# Patient Record
Sex: Male | Born: 1950 | Race: White | Hispanic: No | Marital: Single | Smoking: Never smoker
Health system: Southern US, Community
[De-identification: ages and names within clinical notes are randomized; demographics above are authoritative.]

## PROBLEM LIST (undated history)

## (undated) DIAGNOSIS — I10 Essential (primary) hypertension: Secondary | ICD-10-CM

## (undated) DIAGNOSIS — I639 Cerebral infarction, unspecified: Secondary | ICD-10-CM

---

## 2016-05-06 ENCOUNTER — Emergency Department (HOSPITAL_COMMUNITY): Payer: Medicare Other

## 2016-05-06 ENCOUNTER — Encounter (HOSPITAL_COMMUNITY): Payer: Self-pay | Admitting: Emergency Medicine

## 2016-05-06 ENCOUNTER — Emergency Department (HOSPITAL_COMMUNITY)
Admission: EM | Admit: 2016-05-06 | Discharge: 2016-05-06 | Disposition: A | Discharge status: Home / Self Care | Payer: Medicare Other | Attending: Emergency Medicine | Admitting: Emergency Medicine

## 2016-05-06 DIAGNOSIS — Y9339 Activity, other involving climbing, rappelling and jumping off: Secondary | ICD-10-CM | POA: Insufficient documentation

## 2016-05-06 DIAGNOSIS — W1789XA Other fall from one level to another, initial encounter: Secondary | ICD-10-CM | POA: Insufficient documentation

## 2016-05-06 DIAGNOSIS — Z8673 Personal history of transient ischemic attack (TIA), and cerebral infarction without residual deficits: Secondary | ICD-10-CM | POA: Insufficient documentation

## 2016-05-06 DIAGNOSIS — Y929 Unspecified place or not applicable: Secondary | ICD-10-CM | POA: Insufficient documentation

## 2016-05-06 DIAGNOSIS — I10 Essential (primary) hypertension: Secondary | ICD-10-CM | POA: Diagnosis not present

## 2016-05-06 DIAGNOSIS — Y999 Unspecified external cause status: Secondary | ICD-10-CM | POA: Insufficient documentation

## 2016-05-06 DIAGNOSIS — S2020XA Contusion of thorax, unspecified, initial encounter: Secondary | ICD-10-CM | POA: Insufficient documentation

## 2016-05-06 DIAGNOSIS — S299XXA Unspecified injury of thorax, initial encounter: Secondary | ICD-10-CM | POA: Diagnosis present

## 2016-05-06 DIAGNOSIS — S20211A Contusion of right front wall of thorax, initial encounter: Secondary | ICD-10-CM

## 2016-05-06 HISTORY — DX: Cerebral infarction, unspecified: I63.9

## 2016-05-06 HISTORY — DX: Essential (primary) hypertension: I10

## 2016-05-06 MED ORDER — ALBUTEROL SULFATE (2.5 MG/3ML) 0.083% IN NEBU: 5.0000 mg | INHALATION_SOLUTION | Freq: Once | RESPIRATORY_TRACT | Status: DC

## 2016-05-06 MED ORDER — METHOCARBAMOL 500 MG PO TABS
750.0000 mg | ORAL_TABLET | Freq: Once | ORAL | Status: AC
  Administered 2016-05-06: 750 mg via ORAL
  Filled 2016-05-06: qty 2

## 2016-05-06 MED ORDER — IBUPROFEN 200 MG PO TABS
400.0000 mg | ORAL_TABLET | Freq: Once | ORAL | Status: AC
  Administered 2016-05-06: 400 mg via ORAL
  Filled 2016-05-06: qty 2

## 2016-05-06 MED ORDER — METHOCARBAMOL 500 MG PO TABS: 1000.0000 mg | ORAL_TABLET | Freq: Three times a day (TID) | ORAL | 0 refills | Status: AC | PRN

## 2016-05-06 MED ORDER — HYDROCODONE-ACETAMINOPHEN 5-325 MG PO TABS
2.0000 | ORAL_TABLET | Freq: Once | ORAL | Status: AC
  Administered 2016-05-06: 2 via ORAL
  Filled 2016-05-06: qty 2

## 2016-05-06 NOTE — ED Notes (Signed)
ED Provider at bedside. 

## 2016-05-06 NOTE — ED Triage Notes (Signed)
Pt reports having pain right ribs after a fall on 05/01/16. Pt states since then he has been having increasing pain and difficulty with taking a deep breath. Pt was seen at hospital in [place] when injury occurred and no fractures were seen then.

## 2016-05-06 NOTE — ED Provider Notes (Signed)
[facility]-EMERGENCY DEPT Provider Note   CSN: [id number] Arrival date & time: 05/06/16  0518     History   Chief Complaint Chief Complaint  Patient presents with  . Rib Injury    HPI Nicholas Clarke is a 66 y.o. male.  Patient c/o fall with chest wall injury 5 days ago.  States was climbing over fence, and fell onto right side. c/o right mid to lower rib pain. Pain is constant, dull, moderate-sev, worse w certain movements, position changes, sneezing, palpation to area. No sob. No cough or uri c/o. No fever or chills. No abd pain. Denies head injury or loc. No headache. No neck or back pain. Denies other pain or injury. Was seen at outside facility, taking 1 hydrocodone prn.    The history is provided by the patient.    Past Medical History:  Diagnosis Date  . Hypertension   . Stroke [facility])    right side weakness post CVA    There are no active problems to display for this patient.   History reviewed. No pertinent surgical history.     Home Medications    Prior to Admission medications   Not on File    Family History History reviewed. No pertinent family history.  Social History Social History  Substance Use Topics  . Smoking status: Never Smoker  . Smokeless tobacco: Never Used  . Alcohol use Yes     Allergies   Patient has no known allergies.   Review of Systems Review of Systems  Constitutional: Negative for fever.  Respiratory: Negative for cough and shortness of breath.   Cardiovascular:       Right lateral chest wall pain  Gastrointestinal: Negative for abdominal pain and vomiting.  Genitourinary: Negative for flank pain and hematuria.  Musculoskeletal: Negative for back pain and neck pain.  Skin: Negative for wound.  Neurological: Negative for weakness, numbness and headaches.     Physical Exam Updated Vital Signs BP 122/77 (BP Location: Left Arm)   Pulse 81   Temp 97.7 F (36.5 C) (Oral)   Resp 20   Ht  (1.702 m)   Wt 74.8 kg    SpO2 97%   BMI 25.84 kg/m   Physical Exam  Constitutional: He appears well-developed and well-nourished. No distress.  HENT:  Head: Atraumatic.  Eyes: Conjunctivae are normal.  Neck: Neck supple. No tracheal deviation present.  Cardiovascular: Normal rate, regular rhythm, normal heart sounds and intact distal pulses.  Exam reveals no gallop and no friction rub.   No murmur heard. Pulmonary/Chest: Effort normal and breath sounds normal. No accessory muscle usage. No respiratory distress. He exhibits tenderness.  Right lateral chest wall tenderness. Normal chest wall movement.   Abdominal: Soft. Bowel sounds are normal. He exhibits no distension. There is no tenderness.  No abd bruising or contusion.   Genitourinary:  Genitourinary Comments: No cva tenderness  Musculoskeletal: He exhibits no edema.  CTLS spine, non tender, aligned, no step off.   Neurological: He is alert.  Steady gait.   Skin: Skin is warm and dry. No rash noted. He is not diaphoretic.  Psychiatric: He has a normal mood and affect.  Nursing note and vitals reviewed.    ED Treatments / Results  Labs (all labs ordered are listed, but only abnormal results are displayed) Labs Reviewed - No data to display  EKG  EKG Interpretation None       Radiology Dg Chest 2 View  Result Date: 05/06/2016 CLINICAL DATA:  Rib pain after fall May 01, 2016. EXAM: CHEST  2 VIEW COMPARISON:  None. FINDINGS: Cardiomediastinal silhouette is unremarkable for this low inspiratory examination with crowded vasculature markings. Calcified aortic arch. The lungs are clear without pleural effusions or focal consolidations. Trachea projects midline and there is no pneumothorax. Included soft tissue planes and osseous structures are non-suspicious. IMPRESSION: No acute cardiopulmonary process. Electronically Signed   By: Awilda Metro M.D.   On: 05/06/2016 06:07    Procedures Procedures (including critical care  time)  Medications Ordered in ED Medications  ibuprofen (ADVIL,MOTRIN) tablet 400 mg (not administered)  HYDROcodone-acetaminophen (NORCO/VICODIN) 5-325 MG per tablet 2 tablet (not administered)  methocarbamol (ROBAXIN) tablet 750 mg (not administered)     Initial Impression / Assessment and Plan / ED Course  I have reviewed the triage vital signs and the nursing notes.  Pertinent labs & imaging results that were available during my care of the patient were reviewed by me and considered in my medical decision making (see chart for details).  Pt last took a single hydrocodone approx 5 hrs ago.  Has a ride, does not have to drive.  Hydrocodone po. Motrin po.  Incentive spirometer - discussed deep breathing, staying active.   cxr neg acute.  Discussed possible occult rib fx.   Patient appears stable for d/c.     Final Clinical Impressions(s) / ED Diagnoses   Final diagnoses:  None    New Prescriptions New Prescriptions   No medications on file     Cathren Laine, MD 05/06/16 [phone]

## 2016-05-06 NOTE — Discharge Instructions (Signed)
It was our pleasure to provide your ER care today - we hope that you feel better.  Stay active, take full and deep breaths - use incentive spirometer 10 full/deep breaths in every 1-2 hours while awake.   Take motrin or aleve as need for pain - take with food.  You may also take hydrocodone as need for pain. No driving when taking hydrocodone. Also, do not take tylenol or acetaminophen containing medication when taking hydrocodone.  You may try robaxin as need for muscle spasm/pain.  Return to ER if worse, new symptoms, fevers, trouble breathing, other concern.

## 2018-05-16 IMAGING — CR DG CHEST 2V
2 series · 2 of 2 positions shown · non-contrast
Comparison: None.

CLINICAL DATA: Rib pain after fall May 01, 2016.

EXAM:
CHEST  2 VIEW

[w chest pa]
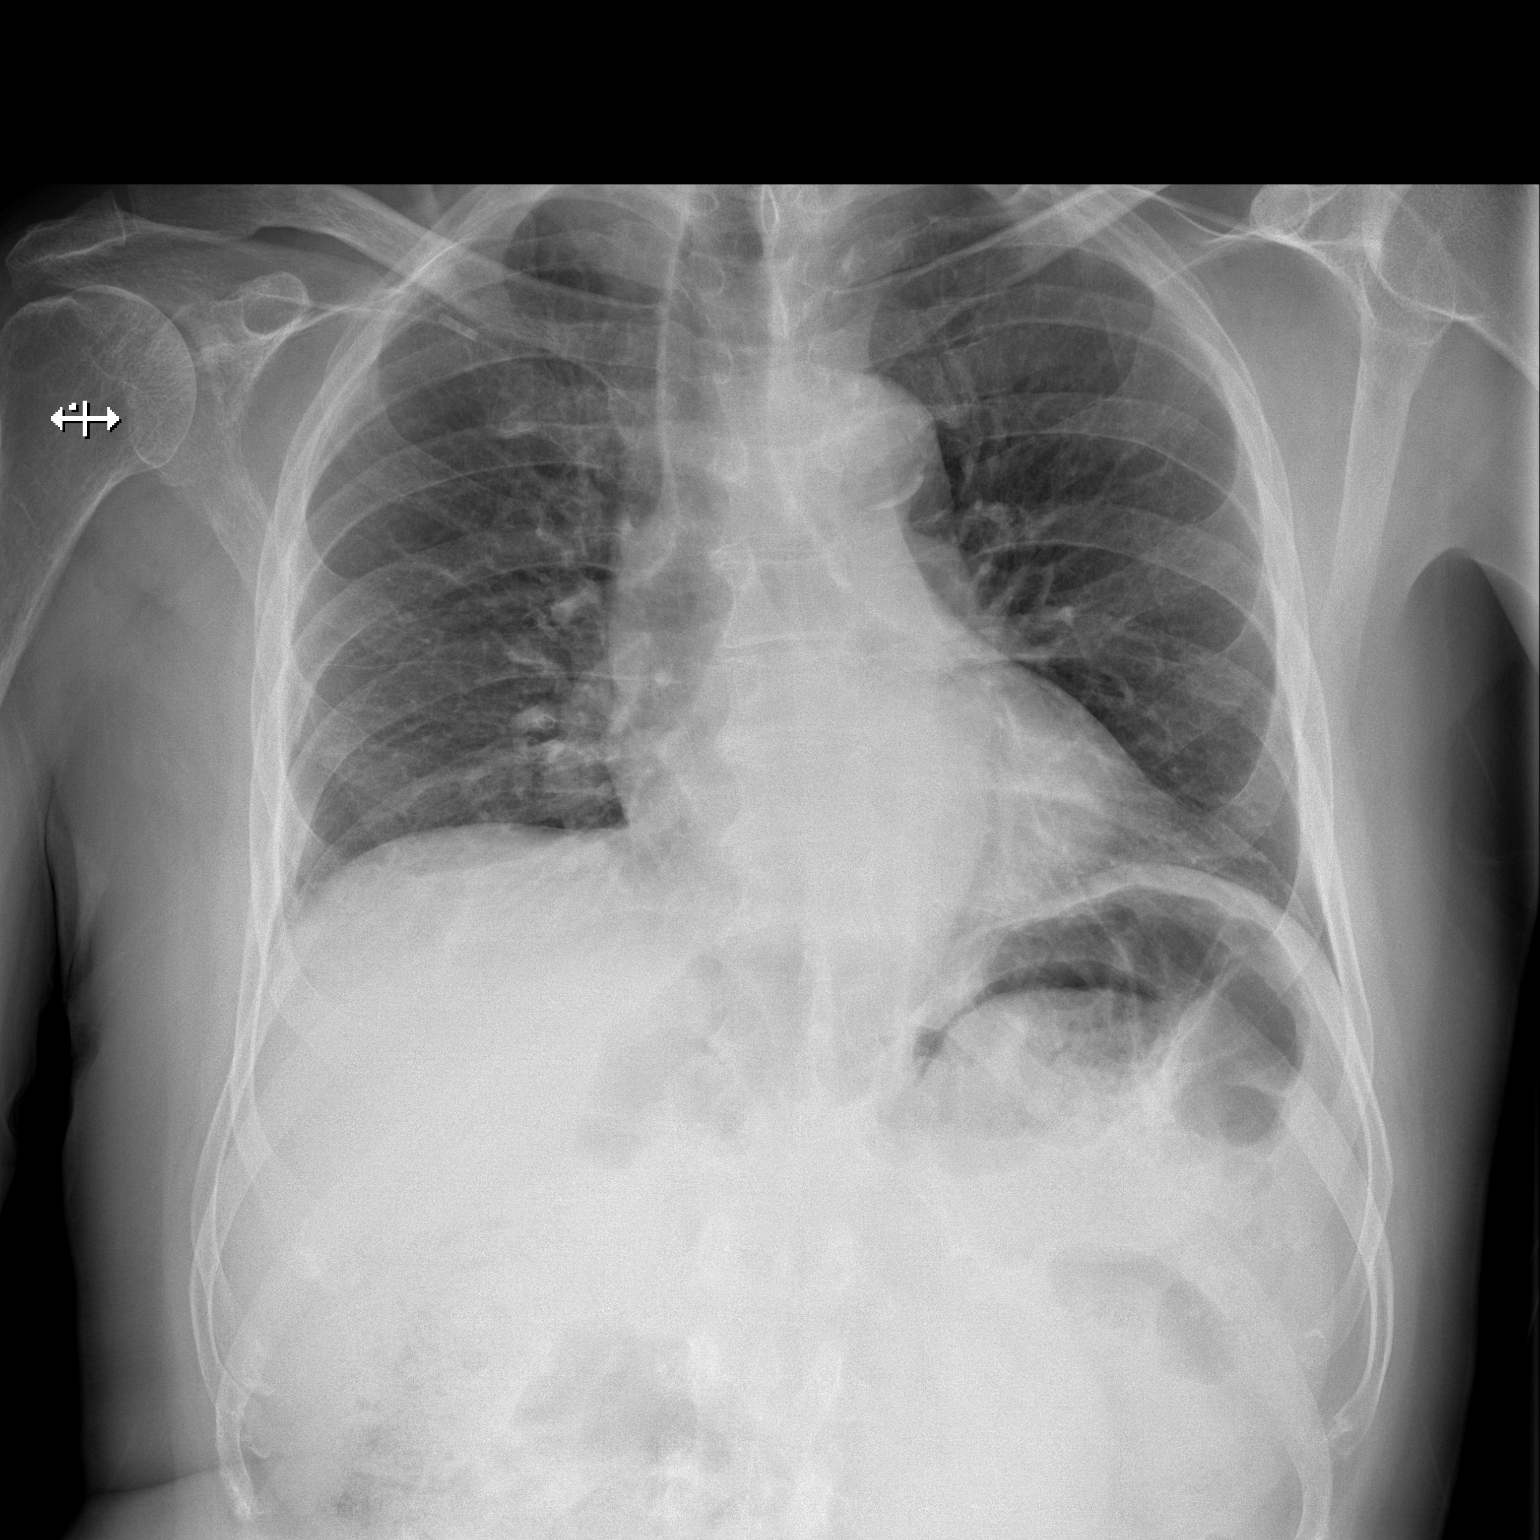

[w chest lat]
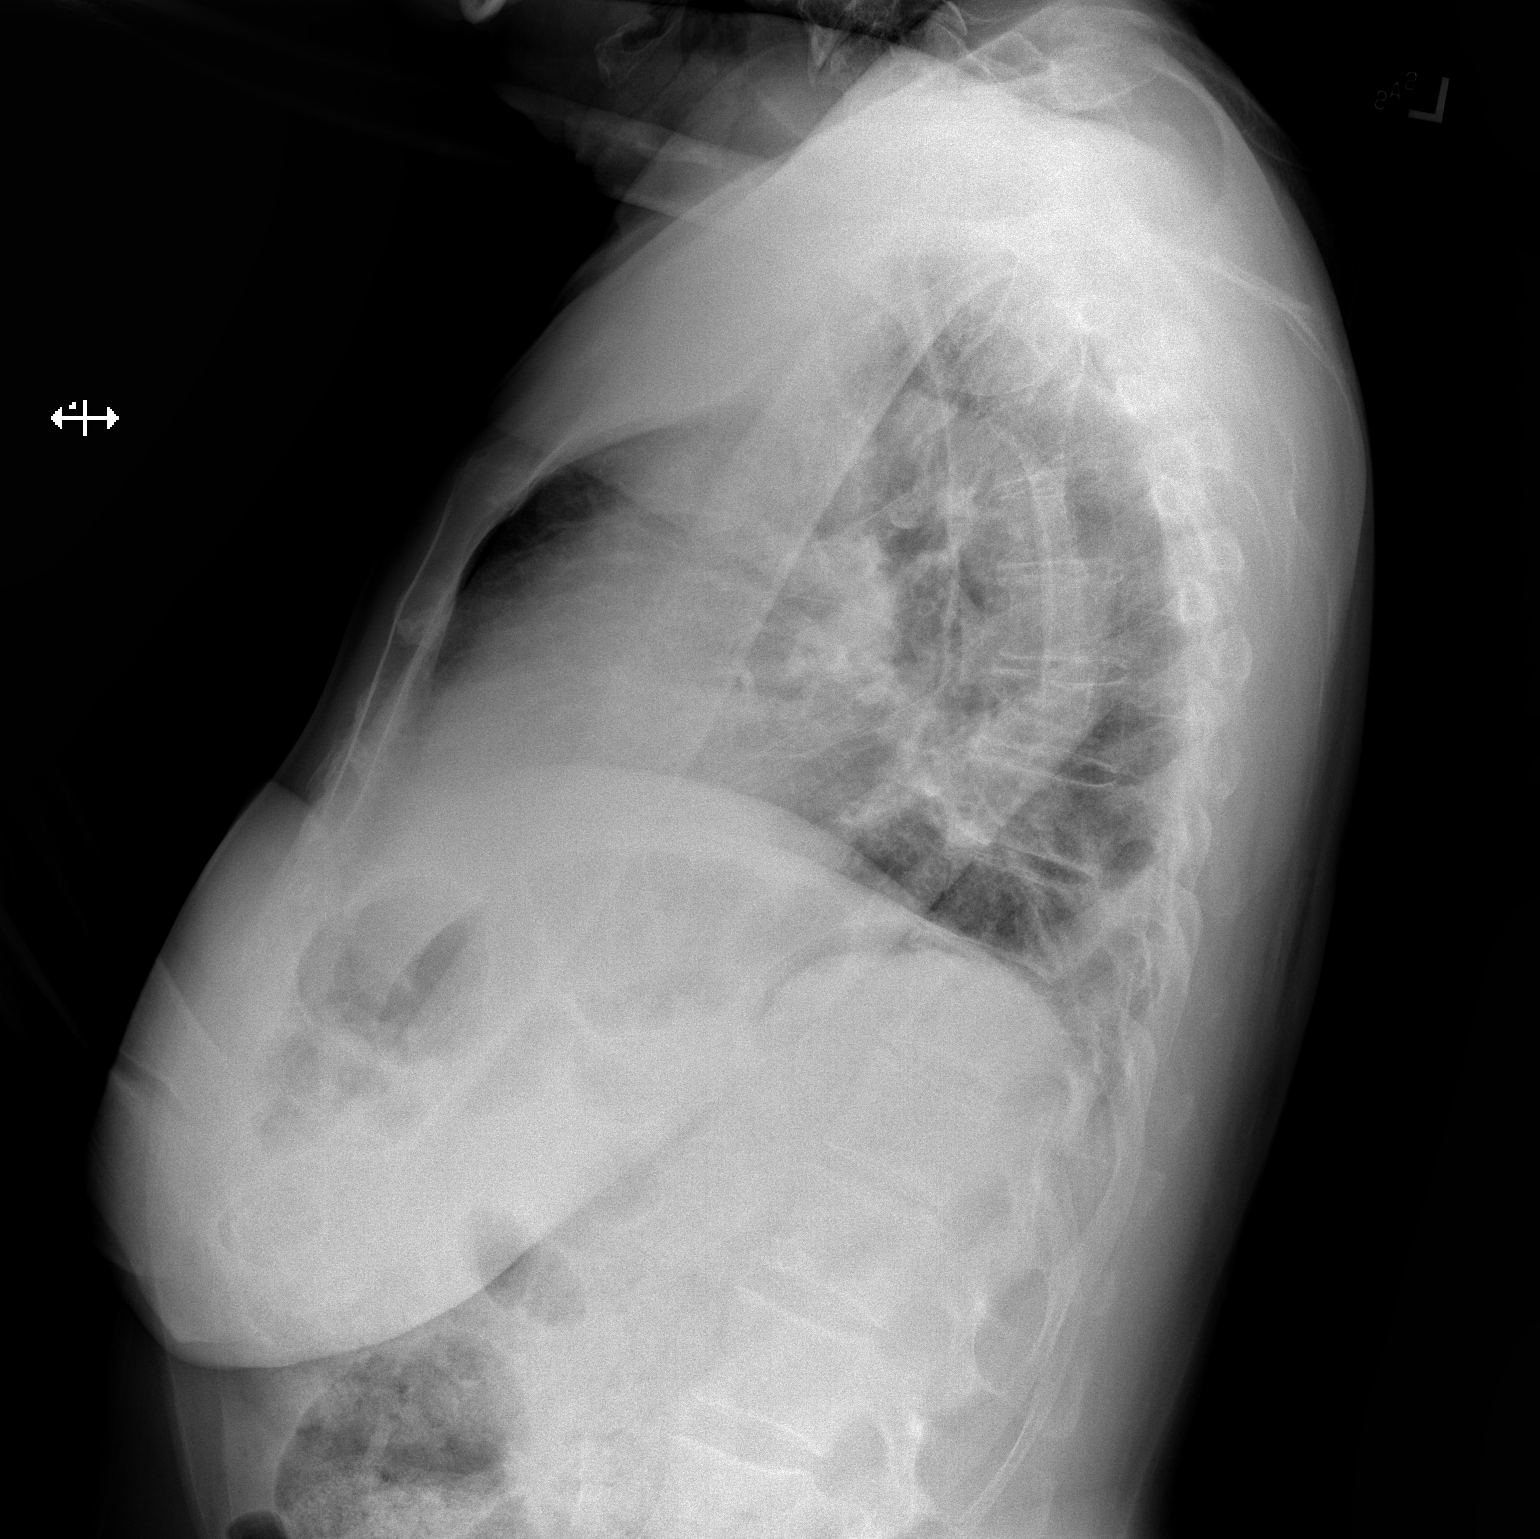

[2 of 2 positions shown; findings below may reference images not displayed]

FINDINGS: Cardiomediastinal silhouette is unremarkable for this low
inspiratory examination with crowded vasculature markings. Calcified
aortic arch. The lungs are clear without pleural effusions or focal
consolidations. Trachea projects midline and there is no
pneumothorax. Included soft tissue planes and osseous structures are
non-suspicious.
IMPRESSION: No acute cardiopulmonary process.
